# Patient Record
Sex: Female | Born: 1970 | Hispanic: No | Marital: Married | Smoking: Never smoker
Health system: Southern US, Community
[De-identification: ages and names within clinical notes are randomized; demographics above are authoritative.]

---

## 1999-02-02 ENCOUNTER — Other Ambulatory Visit: Admission: RE | Admit: 1999-02-02 | Discharge: 1999-02-02 | Payer: Self-pay | Admitting: Obstetrics and Gynecology

## 2001-02-03 ENCOUNTER — Other Ambulatory Visit: Admission: RE | Admit: 2001-02-03 | Discharge: 2001-02-03 | Payer: Self-pay | Admitting: Obstetrics and Gynecology

## 2008-11-21 ENCOUNTER — Ambulatory Visit: Payer: Self-pay | Admitting: Occupational Medicine

## 2013-09-09 ENCOUNTER — Ambulatory Visit: Payer: BC Managed Care – PPO | Admitting: Licensed Clinical Social Worker

## 2013-09-28 ENCOUNTER — Ambulatory Visit: Payer: BC Managed Care – PPO | Admitting: Licensed Clinical Social Worker

## 2013-10-28 ENCOUNTER — Ambulatory Visit (INDEPENDENT_AMBULATORY_CARE_PROVIDER_SITE_OTHER): Payer: BC Managed Care – PPO | Admitting: Licensed Clinical Social Worker

## 2013-10-28 DIAGNOSIS — F39 Unspecified mood [affective] disorder: Secondary | ICD-10-CM

## 2013-11-25 ENCOUNTER — Ambulatory Visit: Payer: PRIVATE HEALTH INSURANCE | Admitting: Licensed Clinical Social Worker

## 2013-12-02 ENCOUNTER — Ambulatory Visit: Payer: PRIVATE HEALTH INSURANCE | Admitting: Licensed Clinical Social Worker

## 2013-12-07 ENCOUNTER — Ambulatory Visit (INDEPENDENT_AMBULATORY_CARE_PROVIDER_SITE_OTHER): Payer: PRIVATE HEALTH INSURANCE | Admitting: Licensed Clinical Social Worker

## 2013-12-07 DIAGNOSIS — F39 Unspecified mood [affective] disorder: Secondary | ICD-10-CM

## 2013-12-23 ENCOUNTER — Ambulatory Visit (INDEPENDENT_AMBULATORY_CARE_PROVIDER_SITE_OTHER): Payer: BC Managed Care – PPO | Admitting: Licensed Clinical Social Worker

## 2013-12-23 DIAGNOSIS — F39 Unspecified mood [affective] disorder: Secondary | ICD-10-CM

## 2014-01-04 ENCOUNTER — Ambulatory Visit (INDEPENDENT_AMBULATORY_CARE_PROVIDER_SITE_OTHER): Payer: BC Managed Care – PPO | Admitting: Licensed Clinical Social Worker

## 2014-01-04 DIAGNOSIS — F39 Unspecified mood [affective] disorder: Secondary | ICD-10-CM

## 2014-01-20 ENCOUNTER — Ambulatory Visit (INDEPENDENT_AMBULATORY_CARE_PROVIDER_SITE_OTHER): Payer: BC Managed Care – PPO | Admitting: Licensed Clinical Social Worker

## 2014-01-20 DIAGNOSIS — F39 Unspecified mood [affective] disorder: Secondary | ICD-10-CM

## 2015-07-02 DIAGNOSIS — K2100 Gastro-esophageal reflux disease with esophagitis, without bleeding: Secondary | ICD-10-CM | POA: Insufficient documentation

## 2015-07-02 DIAGNOSIS — N6019 Diffuse cystic mastopathy of unspecified breast: Secondary | ICD-10-CM | POA: Insufficient documentation

## 2017-01-24 ENCOUNTER — Encounter: Payer: Self-pay | Admitting: Family Medicine

## 2017-01-24 ENCOUNTER — Ambulatory Visit (INDEPENDENT_AMBULATORY_CARE_PROVIDER_SITE_OTHER): Payer: BC Managed Care – PPO | Admitting: Family Medicine

## 2017-01-24 DIAGNOSIS — G8929 Other chronic pain: Secondary | ICD-10-CM | POA: Diagnosis not present

## 2017-01-24 DIAGNOSIS — M25562 Pain in left knee: Secondary | ICD-10-CM | POA: Diagnosis not present

## 2017-01-24 DIAGNOSIS — M25552 Pain in left hip: Secondary | ICD-10-CM

## 2017-01-24 MED ORDER — PREDNISONE 10 MG PO TABS: ORAL_TABLET | ORAL | 0 refills | Status: AC

## 2017-01-24 NOTE — Assessment & Plan Note (Signed)
2/2 piriformis syndrome with sciatica.  Start prednisone dose pack then transition back to meloxicam.  Shown home exercises and stretches to do daily.  Ice or heat if needed.  Tennis ball massage.  F/u in 1 month.

## 2017-01-24 NOTE — Assessment & Plan Note (Signed)
describes feeling like knee pops out of place but not patella.  Her exam is reassuring and all ligaments intact.  Encouraged general quad and hamstring strengthening, continue brace for support.

## 2017-01-24 NOTE — Patient Instructions (Signed)
You have piriformis syndrome with sciatica. Take prednisone dose pack as directed until this is gone. Stop the meloxicam while you're on the prednisone but you can restart it when you're done. Try to avoid painful activities when possible. Can use tennis ball to massage area when sitting Pick 2-3 stretches where you feel the pull in the area of pain - do 3 of these and hold for 20-30 seconds at least once a day Standing hip rotations and side raises 3 sets of 10 once a day. Add ankle weight if these become too easy. Consider physical therapy Follow up with me in 1 month.

## 2017-01-24 NOTE — Progress Notes (Signed)
PCP: [facility]  Subjective:   HPI: Patient is a 46 y.o. female here for left knee and hip pain.  Patient initially made appointment for left knee as this intermittently feels like it pops out of place for past 7 years. Last time it did this was about 5 months ago - she pops it back in but reports it's not the patella. Using knee brace when she works out. Has had normal radiographs as well. Pain gets up to 6/10 and sharp deep anterior knee. Now her left hip hurts worse. Pain posterior left hip, buttock above 6/10 level and sharp. Worse with walking, wakes her up at night. Radiates down left leg with associated numbness into foot. No back pain, bowel/bladder dysfunction.  No past medical history on file.  No current outpatient prescriptions on file prior to visit.   No current facility-administered medications on file prior to visit.     No past surgical history on file.  Allergies  Allergen Reactions  . Codeine     REACTION: Vomiting    Social History   Social History  . Marital status: Married    Spouse name: N/A  . Number of children: N/A  . Years of education: N/A   Occupational History  . Not on file.   Social History Main Topics  . Smoking status: Never Smoker  . Smokeless tobacco: Never Used  . Alcohol use Not on file  . Drug use: Unknown  . Sexual activity: Not on file   Other Topics Concern  . Not on file   Social History Narrative  . No narrative on file    No family history on file.  BP 136/79   Pulse 68   Ht 5\' 7"  (1.702 m)   Wt 130 lb (59 kg)   BMI 20.36 kg/m   Review of Systems: See HPI above.     Objective:  Physical Exam:  Gen: NAD, comfortable in exam room  Left knee: No gross deformity, ecchymoses, effusion. No TTP. FROM. Negative ant/post drawers. Negative valgus/varus testing. Negative lachmanns. Negative mcmurrays, apleys, patellar apprehension. NV intact distally.  Right knee: FROM without  pain.   Back/left hip: No gross deformity, scoliosis. TTP Mildly over piriformis.  No midline or bony TTP. FROM. Strength LEs 5/5 all muscle groups including hip abduction. 2+ MSRs in patellar and achilles tendons, equal bilaterally. Positive left SLR, negative right. Sensation intact to light touch bilaterally. Negative logroll bilateral hips Mild pain piriformis stretch, negative fabers.  Assessment & Plan:  1. Left knee pain - describes feeling like knee pops out of place but not patella.  Her exam is reassuring and all ligaments intact.  Encouraged general quad and hamstring strengthening, continue brace for support.  2. Left hip pain - 2/2 piriformis syndrome with sciatica.  Start prednisone dose pack then transition back to meloxicam.  Shown home exercises and stretches to do daily.  Ice or heat if needed.  Tennis ball massage.  F/u in 1 month.

## 2017-02-07 ENCOUNTER — Ambulatory Visit (INDEPENDENT_AMBULATORY_CARE_PROVIDER_SITE_OTHER): Payer: BC Managed Care – PPO | Admitting: Family Medicine

## 2017-02-07 ENCOUNTER — Encounter: Payer: Self-pay | Admitting: Family Medicine

## 2017-02-07 VITALS — BP 115/78 | HR 64 | Ht 67.0 in | Wt 130.0 lb

## 2017-02-07 DIAGNOSIS — M5416 Radiculopathy, lumbar region: Secondary | ICD-10-CM

## 2017-02-07 MED ORDER — DICLOFENAC SODIUM 75 MG PO TBEC: 75.0000 mg | DELAYED_RELEASE_TABLET | Freq: Two times a day (BID) | ORAL | 1 refills | Status: AC

## 2017-02-07 NOTE — Patient Instructions (Signed)
We will go ahead with an MRI of your lumbar spine. Take diclofenac twice a day with food for pain and inflammation. Ok to take tylenol in addition to this but stop the meloxicam. I will call you with results and next steps.

## 2017-02-08 DIAGNOSIS — M5416 Radiculopathy, lumbar region: Secondary | ICD-10-CM | POA: Insufficient documentation

## 2017-02-08 NOTE — Progress Notes (Addendum)
PCP: [facility]  Subjective:   HPI: Patient is a 46 y.o. female here for left knee and hip pain.  7/13: Patient initially made appointment for left knee as this intermittently feels like it pops out of place for past 7 years. Last time it did this was about 5 months ago - she pops it back in but reports it's not the patella. Using knee brace when she works out. Has had normal radiographs as well. Pain gets up to 6/10 and sharp deep anterior knee. Now her left hip hurts worse. Pain posterior left hip, buttock above 6/10 level and sharp. Worse with walking, wakes her up at night. Radiates down left leg with associated numbness into foot. No back pain, bowel/bladder dysfunction.  7/27: Patient reports back and pain into left leg worse. Prednisone helped only for a couple days. Pain level is 10/10 and sharp. Associated numbness in radiation down to back of leg and foot. Worse in morning, with prolonged walking and standing. No bowel/bladder dysfunction.  No past medical history on file.  Current Outpatient Prescriptions on File Prior to Visit  Medication Sig Dispense Refill  . predniSONE (DELTASONE) 10 MG tablet 6 tabs po day 1, 5 tabs po day 2, 4 tabs po day 3, 3 tabs po day 4, 2 tabs po day 5, 1 tab po day 6 21 tablet 0   No current facility-administered medications on file prior to visit.     No past surgical history on file.  Allergies  Allergen Reactions  . Codeine     REACTION: Vomiting    Social History   Social History  . Marital status: Married    Spouse name: N/A  . Number of children: N/A  . Years of education: N/A   Occupational History  . Not on file.   Social History Main Topics  . Smoking status: Never Smoker  . Smokeless tobacco: Never Used  . Alcohol use Not on file  . Drug use: Unknown  . Sexual activity: Not on file   Other Topics Concern  . Not on file   Social History Narrative  . No narrative on file    No family  history on file.  BP 115/78   Pulse 64   Ht 5\' 7"  (1.702 m)   Wt 130 lb (59 kg)   BMI 20.36 kg/m   Review of Systems: See HPI above.     Objective:  Physical Exam:  Gen: NAD, comfortable in exam room  Back/left hip: No gross deformity, scoliosis. TTP mildly left lumbar paraspinal region.  No midline or bony TTP. Full extension but flexion only to 30 degrees due to pain. Strength LEs 4/5 with knee extension, 5/5 all other muscle groups. 1+ MSR left patellar, 2+ right patella, 2+ bilateral achilles tendons. Positive left SLR, negative right. Sensation intact to light touch bilaterally. Negative logroll bilateral hips  Assessment & Plan:  1. Lumbar radiculopathy - Unfortunately only transient improvement with prednisone dose pack.  Now with some weakness (though may be due to her pain) and decreased patellar reflex on left.  Will go ahead with MRI to further assess.  In meantime diclofenac with tylenol.  Stop meloxicam.  Addendum:  MRI reviewed and discussed with patient.  Noted L5-S1 disc herniation and S1 radiculopathy on left.  She would like to think about next steps and contact us on how she would like to proceed (PT, extended prednisone pack, ESI).

## 2017-02-08 NOTE — Assessment & Plan Note (Signed)
Unfortunately only transient improvement with prednisone dose pack.  Now with some weakness (though may be due to her pain) and decreased patellar reflex on left.  Will go ahead with MRI to further assess.  In meantime diclofenac with tylenol.  Stop meloxicam.

## 2017-02-10 ENCOUNTER — Ambulatory Visit (INDEPENDENT_AMBULATORY_CARE_PROVIDER_SITE_OTHER): Payer: BC Managed Care – PPO

## 2017-02-10 DIAGNOSIS — M5116 Intervertebral disc disorders with radiculopathy, lumbar region: Secondary | ICD-10-CM | POA: Diagnosis not present

## 2017-02-10 DIAGNOSIS — M5416 Radiculopathy, lumbar region: Secondary | ICD-10-CM

## 2017-02-10 DIAGNOSIS — M5117 Intervertebral disc disorders with radiculopathy, lumbosacral region: Secondary | ICD-10-CM

## 2017-02-10 IMAGING — MR MR LUMBAR SPINE W/O CM
4 of 5 series · 26 of 48 positions shown · non-contrast
Comparison: None.

CLINICAL DATA: Left leg pain and foot numbness over the last year,
worsening over the last several weeks.

EXAM:
MRI LUMBAR SPINE WITHOUT CONTRAST
TECHNIQUE: Multiplanar, multisequence MR imaging of the lumbar spine was
performed. No intravenous contrast was administered.

[Series 2: T2 · sagittal · 4.0mm · 0.81mm/px · 6 of 15 slices shown (1 of 2)]
[im 1/15]
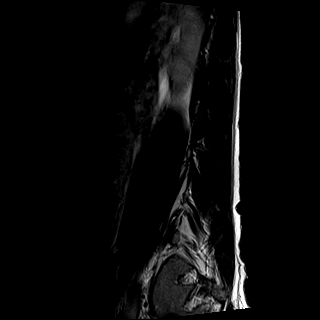
[im 3/15]
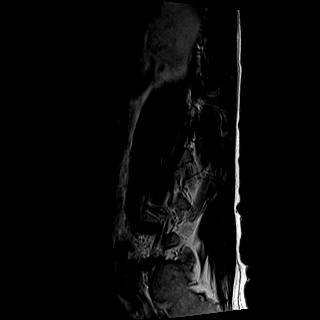
[im 6/15]
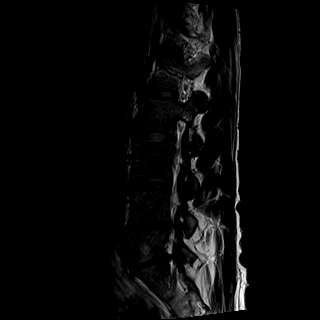
[im 9/15]
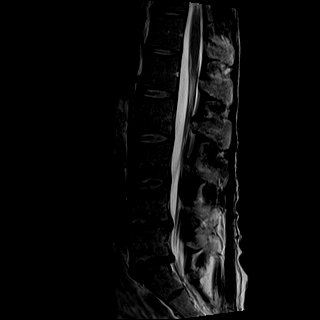
[im 12/15]
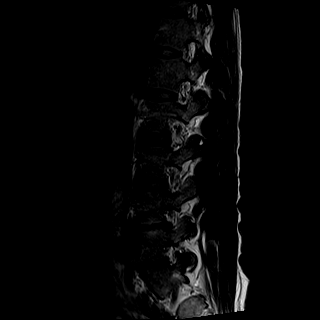
[im 15/15]
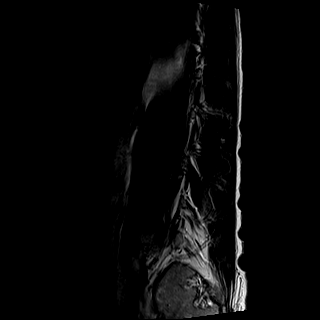

[Series 3: T1 · sagittal · 4.0mm · 0.41mm/px · 6 of 15 slices shown (1 of 2)]
[im 1/15]
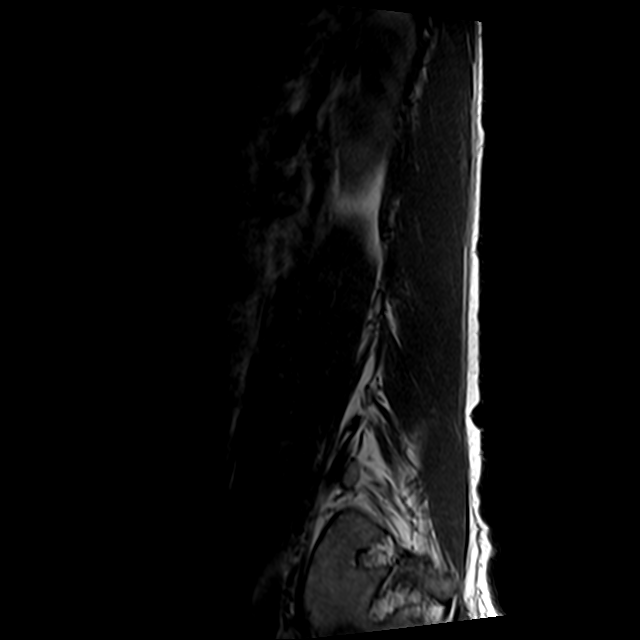
[im 3/15]
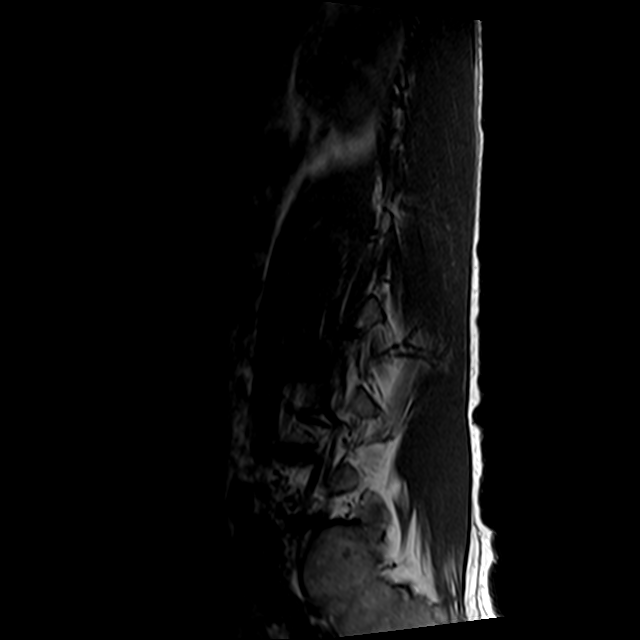
[im 6/15]
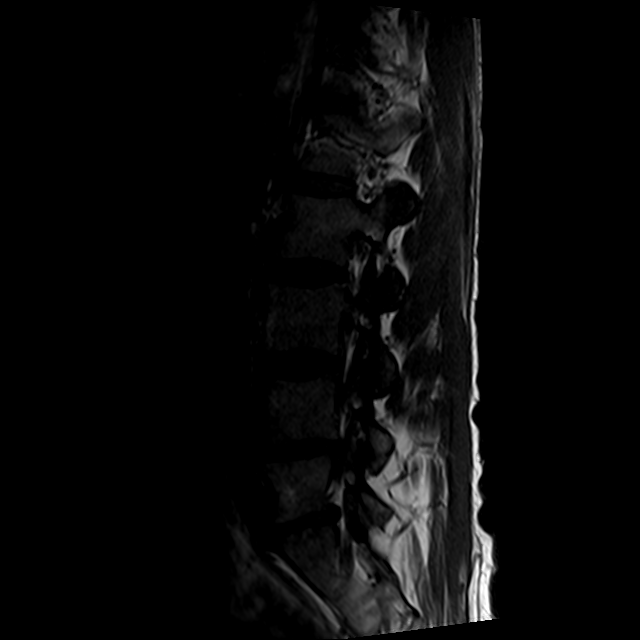
[im 9/15]
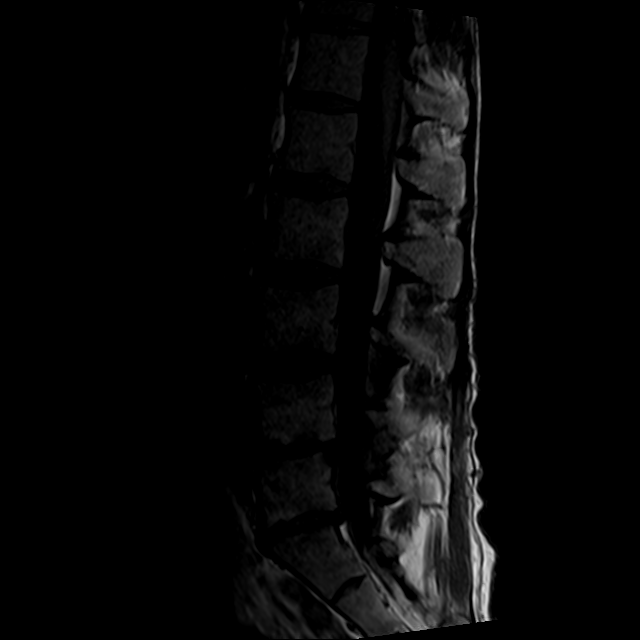
[im 12/15]
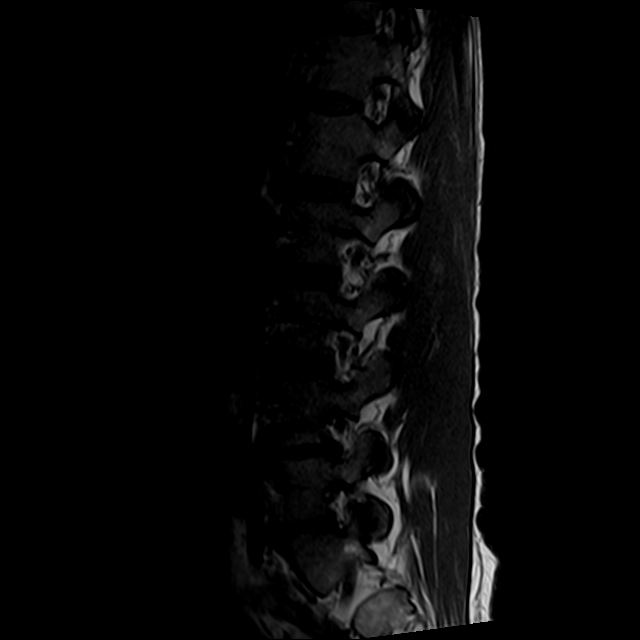
[im 15/15]
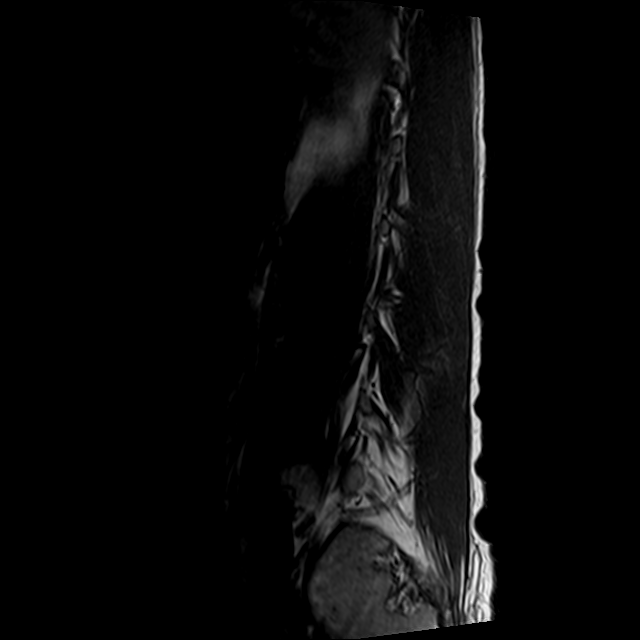

[Series 5: T2 · axial · 4.0mm · 0.78mm/px · z∈[-94,+92]mm · 9 of 35 slices shown (2 of 2)]
[im 1/35]
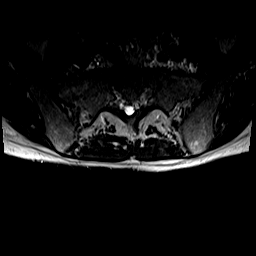
[im 5/35]
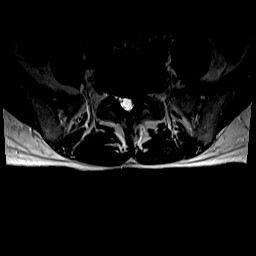
[im 10/35]
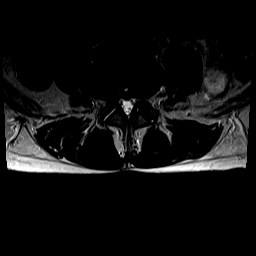
[im 15/35]
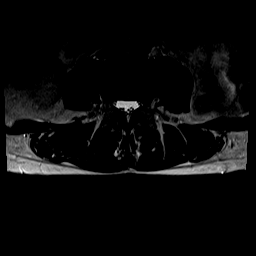
[im 18/35]
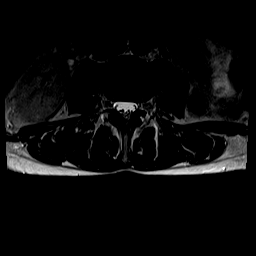
[im 20/35]
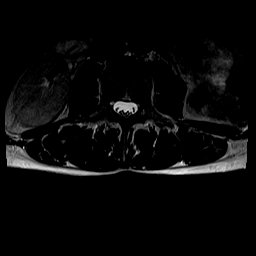
[im 25/35]
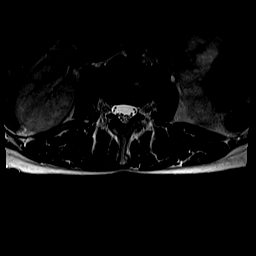
[im 30/35]
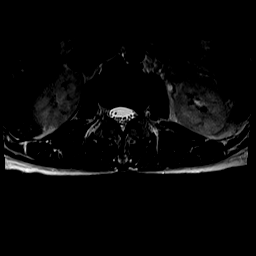
[im 35/35]
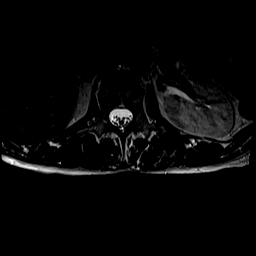

[Series 6: T1 · axial · 4.0mm · 0.39mm/px · z∈[-94,+67]mm · 5 of 35 slices shown (2 of 2)]
[im 1/35]
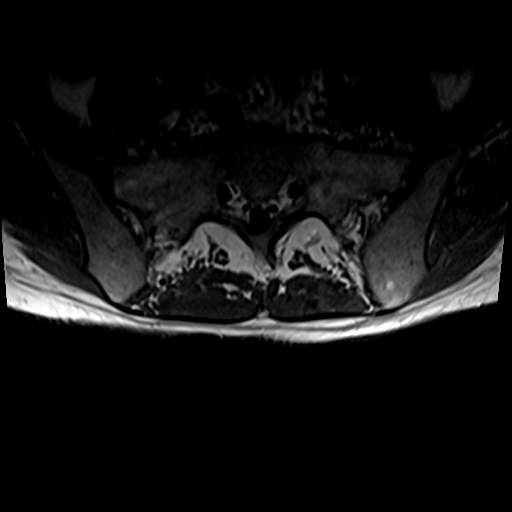
[im 5/35]
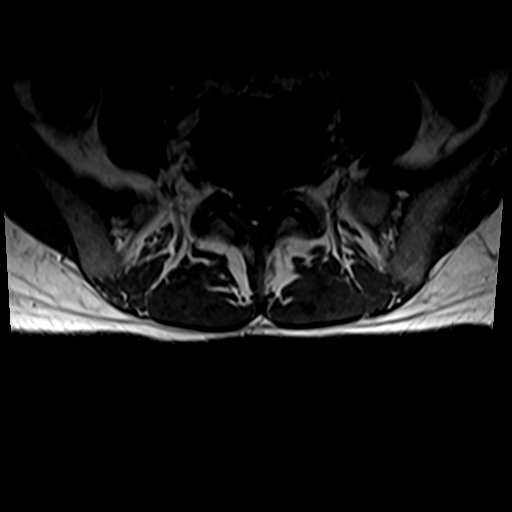
[im 10/35]
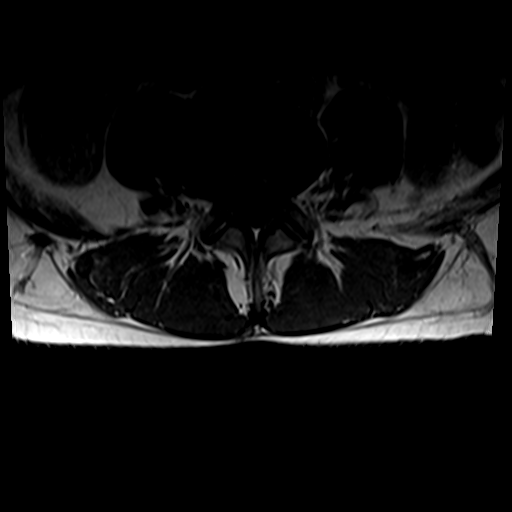
[im 18/35]
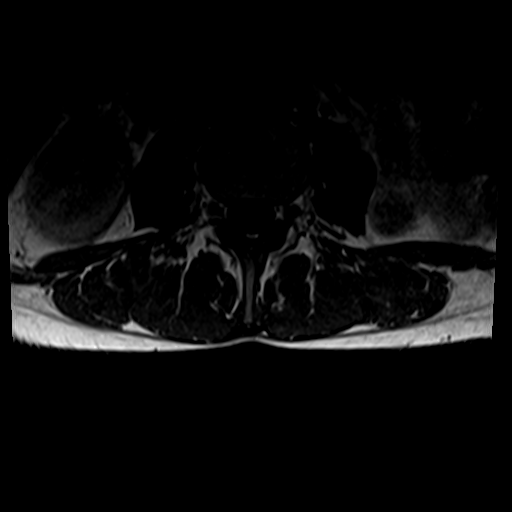
[im 30/35]
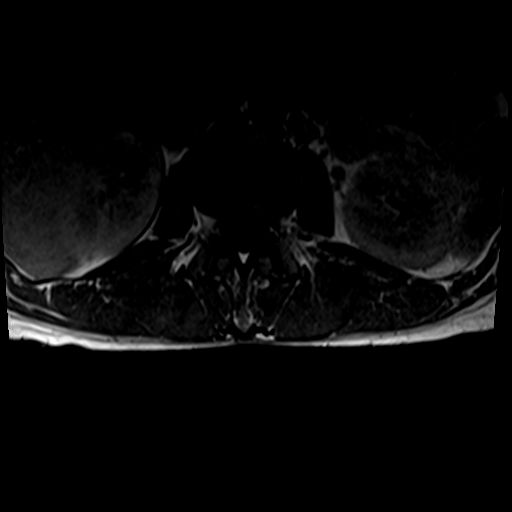

[26 of 48 positions shown; findings below may reference images not displayed]

FINDINGS: Segmentation:  5 lumbar type vertebral bodies.

Alignment: Mild curvature convex to the left with the apex at L3-4.
Straightening of the normal lumbar lordosis.

Vertebrae:  No primary bone lesion.

Conus medullaris: Extends to the L1 level and appears normal.

Paraspinal and other soft tissues: Negative

Disc levels:

No abnormality at L2-3 or above.

L3-4: No disc abnormality. Minimal facet hypertrophy. No stenosis.

L4-5: Disc degeneration with shallow protrusion, and central disc
herniation. This indents the thecal sac. There is narrowing of both
lateral recesses that could cause neural compression. Foramina are
sufficiently patent to allow free exiting of the L4 nerves.

L5-S1: Left posterolateral disc herniation with left lateral recess
stenosis compressing the left S1 nerve.
IMPRESSION: L5-S1: Left posterolateral disc herniation with left lateral recess
stenosis compressing the left S1 nerve.

L4-5: Disc degeneration with shallow protrusion and small central
herniation. Narrowing of both lateral recesses that could possibly
cause neural compression, though the findings are not as pronounced
as the L5-S1 level.

## 2017-02-12 ENCOUNTER — Telehealth: Payer: Self-pay | Admitting: Family Medicine

## 2017-02-12 NOTE — Telephone Encounter (Signed)
Order sent.

## 2017-02-12 NOTE — Telephone Encounter (Signed)
Please set her up for left L5-S1 ESI for S1 radiculopathy.  Thanks!

## 2017-02-12 NOTE — Telephone Encounter (Signed)
Patient states she would like to go through with the epidural injection that was discussed yesterday

## 2017-02-13 ENCOUNTER — Other Ambulatory Visit: Payer: Self-pay | Admitting: Family Medicine

## 2017-02-13 DIAGNOSIS — M5416 Radiculopathy, lumbar region: Secondary | ICD-10-CM

## 2017-02-24 ENCOUNTER — Ambulatory Visit (INDEPENDENT_AMBULATORY_CARE_PROVIDER_SITE_OTHER): Payer: BC Managed Care – PPO | Admitting: Family Medicine

## 2017-02-24 ENCOUNTER — Ambulatory Visit
Admission: RE | Admit: 2017-02-24 | Discharge: 2017-02-24 | Disposition: A | Discharge status: Home / Self Care | Payer: BC Managed Care – PPO | Source: Ambulatory Visit | Attending: Family Medicine | Admitting: Family Medicine

## 2017-02-24 ENCOUNTER — Ambulatory Visit: Payer: BC Managed Care – PPO | Admitting: Family Medicine

## 2017-02-24 ENCOUNTER — Encounter: Payer: Self-pay | Admitting: Family Medicine

## 2017-02-24 ENCOUNTER — Other Ambulatory Visit: Payer: Self-pay | Admitting: Family Medicine

## 2017-02-24 VITALS — BP 107/74 | HR 75 | Ht 67.0 in | Wt 130.0 lb

## 2017-02-24 DIAGNOSIS — M5416 Radiculopathy, lumbar region: Secondary | ICD-10-CM

## 2017-02-24 MED ORDER — HYDROCODONE-ACETAMINOPHEN 5-325 MG PO TABS: 1.0000 | ORAL_TABLET | Freq: Four times a day (QID) | ORAL | 0 refills | Status: AC | PRN

## 2017-02-24 MED ORDER — IOPAMIDOL (ISOVUE-M 200) INJECTION 41%
1.0000 mL | Freq: Once | INTRAMUSCULAR | Status: AC
  Administered 2017-02-24: 1 mL via EPIDURAL

## 2017-02-24 MED ORDER — METHYLPREDNISOLONE ACETATE 40 MG/ML INJ SUSP (RADIOLOG
120.0000 mg | Freq: Once | INTRAMUSCULAR | Status: AC
  Administered 2017-02-24: 120 mg via EPIDURAL

## 2017-02-24 MED ORDER — METAXALONE 800 MG PO TABS: 800.0000 mg | ORAL_TABLET | Freq: Three times a day (TID) | ORAL | 1 refills | Status: AC | PRN

## 2017-02-24 NOTE — Discharge Instructions (Signed)

## 2017-02-25 NOTE — Progress Notes (Signed)
Patient had her ESI today - wanted to have her see us or contact us a week following this to let us know how she's doing.  Will no charge today's visit.  Provided with small dosage of norco, skelaxin as needed after checking database.

## 2017-03-11 ENCOUNTER — Ambulatory Visit (INDEPENDENT_AMBULATORY_CARE_PROVIDER_SITE_OTHER): Payer: BC Managed Care – PPO | Admitting: Family Medicine

## 2017-03-11 ENCOUNTER — Encounter: Payer: Self-pay | Admitting: Family Medicine

## 2017-03-11 DIAGNOSIS — M5416 Radiculopathy, lumbar region: Secondary | ICD-10-CM

## 2017-03-11 NOTE — Patient Instructions (Addendum)
Continue medications as you have been. We will refer you to neurosurgery to discuss operative intervention. I don't think repeating the epidural injections or trying a nerve blocking medication (gabapentin, lyrica) would provide much relief.

## 2017-03-12 NOTE — Progress Notes (Signed)
PCP: [facility]  Subjective:   HPI: Patient is a 46 y.o. female here for left knee and hip pain.  7/13: Patient initially made appointment for left knee as this intermittently feels like it pops out of place for past 7 years. Last time it did this was about 5 months ago - she pops it back in but reports it's not the patella. Using knee brace when she works out. Has had normal radiographs as well. Pain gets up to 6/10 and sharp deep anterior knee. Now her left hip hurts worse. Pain posterior left hip, buttock above 6/10 level and sharp. Worse with walking, wakes her up at night. Radiates down left leg with associated numbness into foot. No back pain, bowel/bladder dysfunction.  7/27: Patient reports back and pain into left leg worse. Prednisone helped only for a couple days. Pain level is 10/10 and sharp. Associated numbness in radiation down to back of leg and foot. Worse in morning, with prolonged walking and standing. No bowel/bladder dysfunction.  8/28: Unfortunately patient has not improved following ESI. Still requiring diclofenac, tylenol regularly to get any pain relief. ESI maybe helped for a couple days. Continues to have sharp pain radiating down left leg.  No past medical history on file.  Current Outpatient Prescriptions on File Prior to Visit  Medication Sig Dispense Refill  . diclofenac (VOLTAREN) 75 MG EC tablet Take 1 tablet (75 mg total) by mouth 2 (two) times daily. 60 tablet 1  . HYDROcodone-acetaminophen (NORCO) 5-325 MG tablet Take 1 tablet by mouth every 6 (six) hours as needed for moderate pain. 20 tablet 0  . metaxalone (SKELAXIN) 800 MG tablet Take 1 tablet (800 mg total) by mouth 3 (three) times daily as needed for muscle spasms. 60 tablet 1  . predniSONE (DELTASONE) 10 MG tablet 6 tabs po day 1, 5 tabs po day 2, 4 tabs po day 3, 3 tabs po day 4, 2 tabs po day 5, 1 tab po day 6 21 tablet 0   No current facility-administered  medications on file prior to visit.     No past surgical history on file.  Allergies  Allergen Reactions  . Codeine Nausea And Vomiting    Social History   Social History  . Marital status: Married    Spouse name: N/A  . Number of children: N/A  . Years of education: N/A   Occupational History  . Not on file.   Social History Main Topics  . Smoking status: Never Smoker  . Smokeless tobacco: Never Used  . Alcohol use Not on file  . Drug use: Unknown  . Sexual activity: Not on file   Other Topics Concern  . Not on file   Social History Narrative  . No narrative on file    No family history on file.  BP 117/78   Pulse 71   Ht 5\' 7"  (1.702 m)   Wt 130 lb (59 kg)   BMI 20.36 kg/m   Review of Systems: See HPI above.     Objective:  Physical Exam:  Gen: NAD, comfortable in exam room  Back exam not repeated today. Back/left hip: No gross deformity, scoliosis. TTP mildly left lumbar paraspinal region.  No midline or bony TTP. Full extension but flexion only to 30 degrees due to pain. Strength LEs 4/5 with knee extension, 5/5 all other muscle groups. 1+ MSR left patellar, 2+ right patella, 2+ bilateral achilles tendons. Positive left SLR, negative right. Sensation intact to light  touch bilaterally. Negative logroll bilateral hips  Assessment & Plan:  1. Lumbar radiculopathy - 2/2 L5-S1 disc herniation with left S1 radiculopathy confirmed by MRI.  Has not improved with prednisone, ESI, home exercise program.  Will go ahead with referral to neurosurgery to discuss operative intervention.    Total visit time 10 minutes - more than half of which spent on counseling, answering questions.

## 2017-03-12 NOTE — Assessment & Plan Note (Signed)
2/2 L5-S1 disc herniation with left S1 radiculopathy confirmed by MRI.  Has not improved with prednisone, ESI, home exercise program.  Will go ahead with referral to neurosurgery to discuss operative intervention.    Total visit time 10 minutes - more than half of which spent on counseling, answering questions.
